# Patient Record
Sex: Male | Born: 2000 | Race: Black or African American | Hispanic: No | Marital: Single | Smoking: Current every day smoker
Health system: Southern US, Community
[De-identification: ages and names within clinical notes are randomized; demographics above are authoritative.]

## PROBLEM LIST (undated history)

## (undated) DIAGNOSIS — J45909 Unspecified asthma, uncomplicated: Secondary | ICD-10-CM

---
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
## 2020-12-03 IMAGING — CR ABDOMEN - 2 VIEW
3 series · 3 of 3 positions shown · non-contrast
Comparison: None.

CLINICAL DATA: Right-sided abdominal pain/cramping. Difficulty
swallowing for 2 days.

EXAM:
ABDOMEN - 2 VIEW

[abdomen erect]
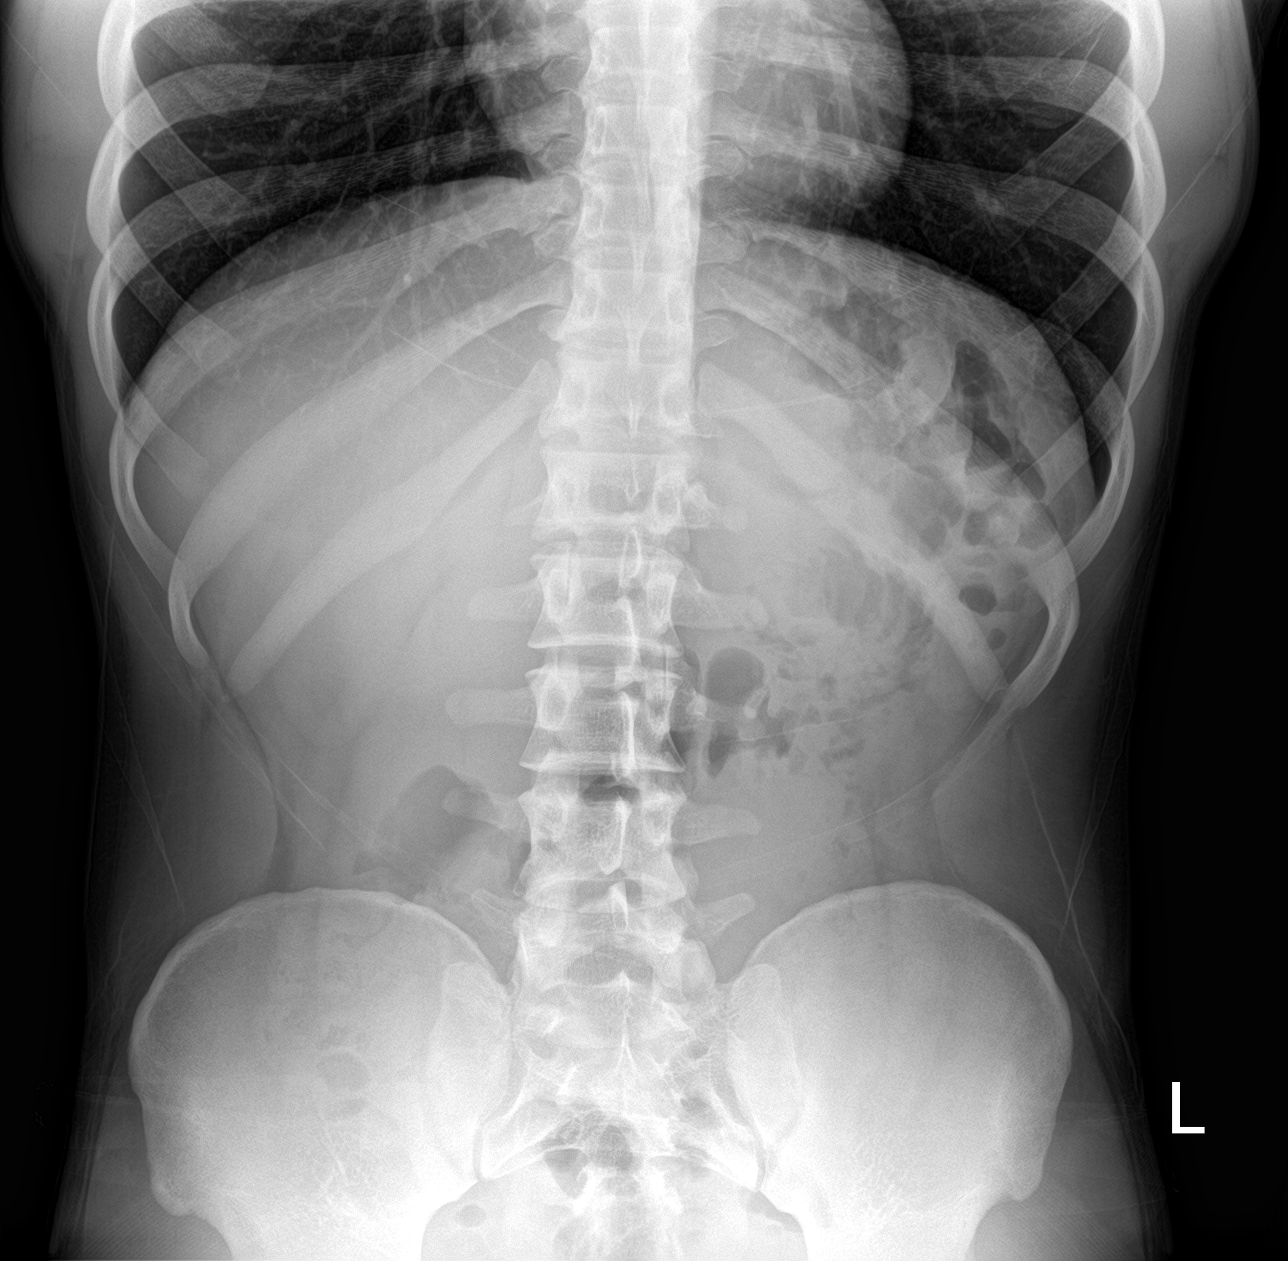

[abdomen supine (1 of 2)]
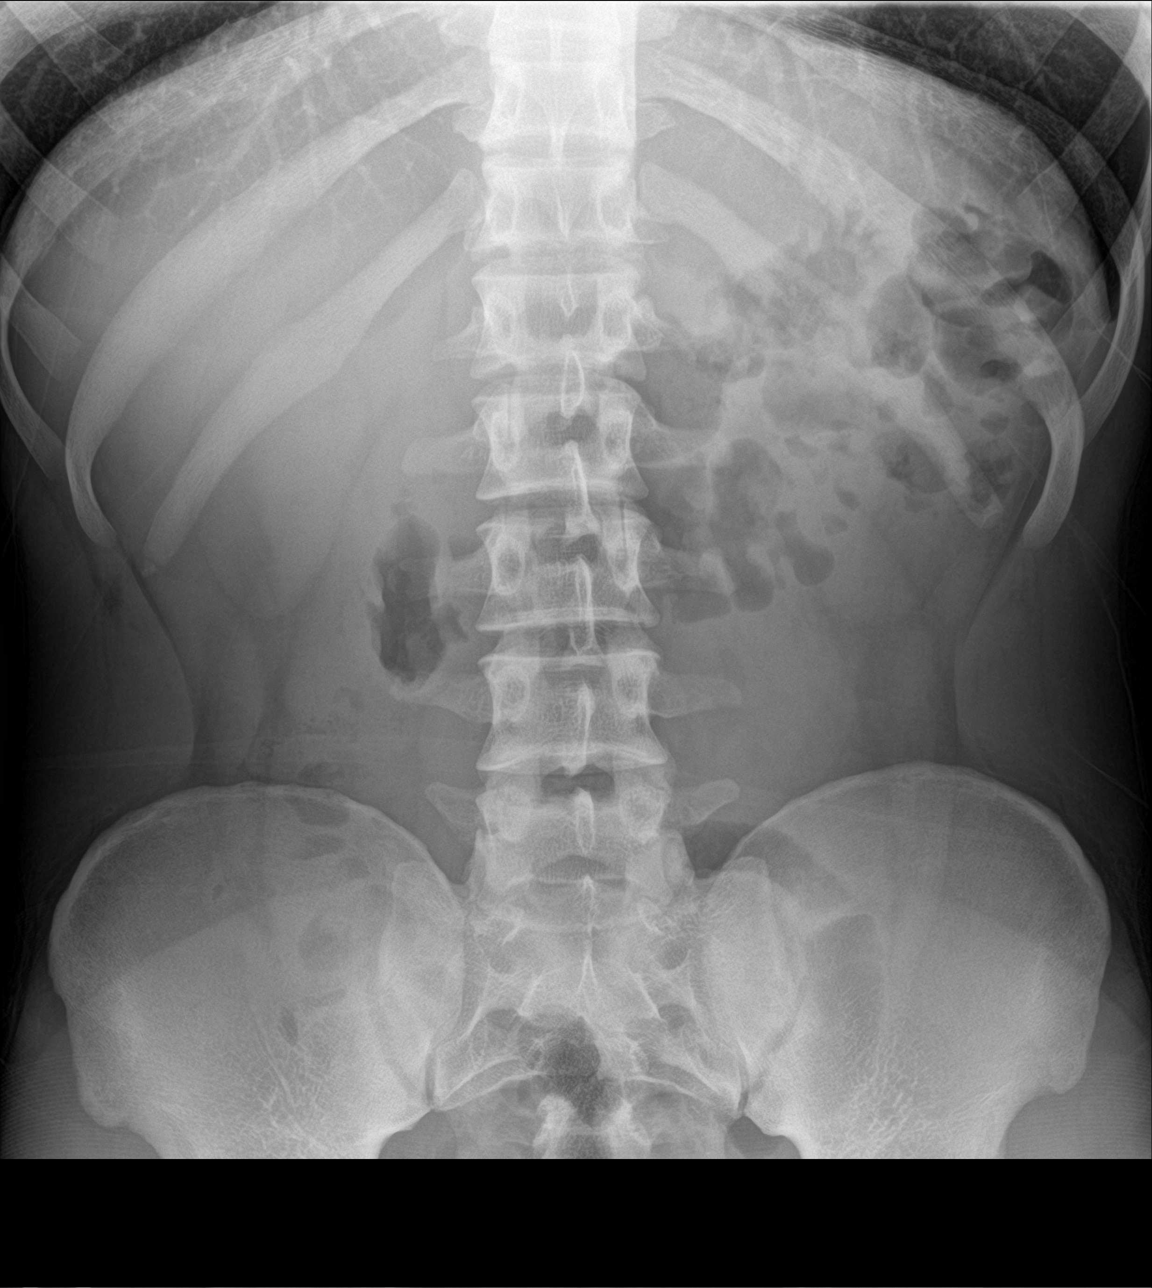

[abdomen supine (2 of 2)]
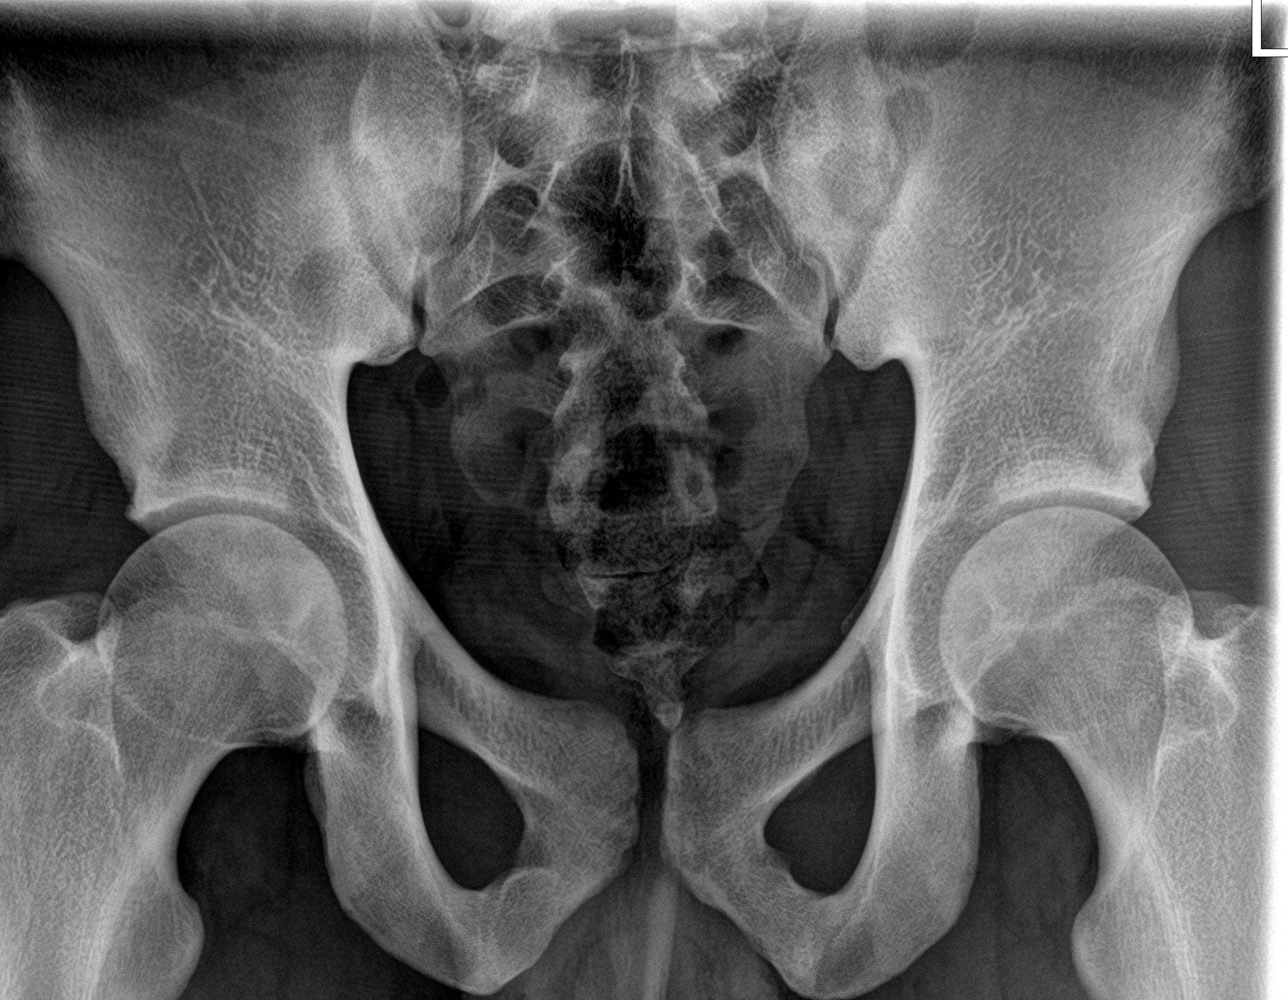

[3 of 3 positions shown; findings below may reference images not displayed]

FINDINGS: There is no evidence of intraperitoneal free air. Gas and a small
amount of stool are present in the colon and rectum. No dilated
loops of bowel are seen to suggest obstruction. The visualized lung
bases are clear. No abnormal soft tissue calcification is seen. The
osseous structures are unremarkable.
IMPRESSION: Negative.

## 2021-10-17 ENCOUNTER — Other Ambulatory Visit: Payer: Self-pay

## 2021-10-17 ENCOUNTER — Emergency Department (HOSPITAL_COMMUNITY)
Admission: EM | Admit: 2021-10-17 | Discharge: 2021-10-17 | Disposition: A | Discharge status: Home / Self Care | Payer: Self-pay | Attending: Emergency Medicine | Admitting: Emergency Medicine

## 2021-10-17 ENCOUNTER — Encounter (HOSPITAL_COMMUNITY): Payer: Self-pay

## 2021-10-17 DIAGNOSIS — N63 Unspecified lump in unspecified breast: Secondary | ICD-10-CM | POA: Insufficient documentation

## 2021-10-17 NOTE — ED Triage Notes (Signed)
Has a lump on left nipple that is tender.  Reports he just found it iin the shower today.

## 2021-10-17 NOTE — ED Provider Notes (Signed)
[facility] EMERGENCY DEPARTMENT Provider Note   CSN: [id number] Arrival date & time: 10/17/21  1225     History  Chief Complaint  Patient presents with   Mass    Travis Sloan is a 21 y.o. male with noncontributory past medical history who presents with concern for a left-sided mass behind the nipple that he noticed in the shower this morning.  Patient has not noticed any nipple drainage, tenderness, redness around the nipple.  He reports he did not notice it before today.  He not noticed that it has been rapidly growing or changing.  Denies strong family history of breast cancer.  Patient denies any pain from the lesion at this time, she is wondering what it could be.  HPI     Home Medications Prior to Admission medications   Medication Sig Start Date End Date Taking? Authorizing Provider  acetaminophen (TYLENOL) 325 MG tablet Take 650 mg by mouth every 6 (six) hours as needed.    [provider]  albuterol (PROVENTIL HFA;VENTOLIN HFA) 108 (90 BASE) MCG/ACT inhaler Inhale 2 puffs into the lungs every 6 (six) hours as needed.    [provider]  ibuprofen (ADVIL,MOTRIN) 600 MG tablet Take 1 tablet (600 mg total) by mouth every 6 (six) hours as needed for fever or mild pain. 05/11/18   Lowanda Foster, NP      Allergies    Patient has no known allergies.    Review of Systems   Review of Systems  All other systems reviewed and are negative.   Physical Exam Updated Vital Signs BP 118/70 (BP Location: Left Arm)   Pulse (!) 55   Temp 98.9 F (37.2 C) (Oral)   Resp 16   Ht 6\' 1"  (1.854 m)   Wt 79.4 kg   SpO2 99%   BMI 23.09 kg/m  Physical Exam Vitals and nursing note reviewed.  Constitutional:      General: He is not in acute distress.    Appearance: Normal appearance.  HENT:     Head: Normocephalic and atraumatic.  Eyes:     General:        Right eye: No discharge.        Left eye: No discharge.  Cardiovascular:     Rate and  Rhythm: Normal rate and regular rhythm.  Pulmonary:     Effort: Pulmonary effort is normal. No respiratory distress.  Musculoskeletal:        General: No deformity.  Skin:    General: Skin is warm and dry.     Comments: There is very small soft, mobile perhaps 0.5 cm nodule behind the left nipple.  It is not red, tender, there is no purulent discharge from the nipple.  Neurological:     Mental Status: He is alert and oriented to person, place, and time.  Psychiatric:        Mood and Affect: Mood normal.        Behavior: Behavior normal.     ED Results / Procedures / Treatments   Labs (all labs ordered are listed, but only abnormal results are displayed) Labs Reviewed - No data to display  EKG None  Radiology No results found.  Procedures Procedures    Medications Ordered in ED Medications - No data to display  ED Course/ Medical Decision Making/ A&P  Medical Decision Making  Patient with left-sided breast mass that he noticed in the shower today.  On physical exam I see no signs of infectious etiology.  Discussed differential including possible malignancy, discussed this is much less likely, versus normal breast tissue versus swollen lymph node versus gynecomastia versus other.  Discussed with patient I do not recommend any further work-up in the emergency department today, recommend that he reach out to a primary care doctor, or breast imaging center for further evaluation. In the meantime I recommend he continue to monitor for any growth or change of the mass.   Patient discharged in stable condition at this time. Final Clinical Impression(s) / ED Diagnoses Final diagnoses:  Mass of nipple    Rx / DC Orders ED Discharge Orders     None         [place] 10/17/21 1311    Pricilla Loveless, MD 10/20/21 1515

## 2021-10-17 NOTE — Discharge Instructions (Addendum)
You can try to call-year-old pediatrician or the number above to help establish new primary care doctor, or call some of the providers in your area and ask if they are taking new patients if you would like someone to take a second look.  As we discussed I think that the mass behind her nipple is normal tissue, however occasionally small masses behind the breast tissue can be a sign of something called gynecomastia which is a condition where you have abnormal breast growth in men, but is not necessarily dangerous.  Continue to monitor the mass to see if it is growing or changing, or becoming more irregular.  You could also reach out to a breast imaging center to see if they want to do a mammogram or ultrasound to further evaluate.  At this time this does not represent an urgent or emergent condition and I would not recommend any further work-up in the emergency department.

## 2021-10-19 ENCOUNTER — Encounter (HOSPITAL_COMMUNITY): Payer: Self-pay | Admitting: Emergency Medicine

## 2021-10-19 ENCOUNTER — Emergency Department (HOSPITAL_COMMUNITY)
Admission: EM | Admit: 2021-10-19 | Discharge: 2021-10-19 | Disposition: A | Discharge status: Home / Self Care | Payer: Self-pay | Attending: Emergency Medicine | Admitting: Emergency Medicine

## 2021-10-19 ENCOUNTER — Emergency Department (HOSPITAL_COMMUNITY): Payer: Self-pay

## 2021-10-19 ENCOUNTER — Other Ambulatory Visit: Payer: Self-pay

## 2021-10-19 DIAGNOSIS — R001 Bradycardia, unspecified: Secondary | ICD-10-CM | POA: Insufficient documentation

## 2021-10-19 DIAGNOSIS — R079 Chest pain, unspecified: Secondary | ICD-10-CM | POA: Insufficient documentation

## 2021-10-19 LAB — BASIC METABOLIC PANEL
Anion gap: 8 (ref 5–15)
BUN: 13 mg/dL (ref 6–20)
CO2: 28 mmol/L (ref 22–32)
Calcium: 9.5 mg/dL (ref 8.9–10.3)
Chloride: 104 mmol/L (ref 98–111)
Creatinine, Ser: 1.31 mg/dL — ABNORMAL HIGH (ref 0.61–1.24)
GFR, Estimated: >60 mL/min (ref >60)
Glucose, Bld: 94 mg/dL (ref 70–99)
Potassium: 3.5 mmol/L (ref 3.5–5.1)
Sodium: 140 mmol/L (ref 135–145)

## 2021-10-19 LAB — TROPONIN I (HIGH SENSITIVITY)
Troponin I (High Sensitivity): 3 ng/L (ref <18)
Troponin I (High Sensitivity): 3 ng/L (ref <18)

## 2021-10-19 LAB — CBC
HCT: 51.7 % (ref 39.0–52.0)
Hemoglobin: 17.1 g/dL — ABNORMAL HIGH (ref 13.0–17.0)
MCH: 28.3 pg (ref 26.0–34.0)
MCHC: 33.1 g/dL (ref 30.0–36.0)
MCV: 85.6 fL (ref 80.0–100.0)
Platelets: 164 10*3/uL (ref 150–400)
RBC: 6.04 MIL/uL — ABNORMAL HIGH (ref 4.22–5.81)
RDW: 13 % (ref 11.5–15.5)
WBC: 9.6 10*3/uL (ref 4.0–10.5)
nRBC: 0 % (ref 0.0–0.2)

## 2021-10-19 LAB — D-DIMER, QUANTITATIVE: D-Dimer, Quant: <0.27 ug/mL-FEU (ref 0.00–0.50)

## 2021-10-19 MED ORDER — ALBUTEROL SULFATE (2.5 MG/3ML) 0.083% IN NEBU
2.5000 mg | INHALATION_SOLUTION | Freq: Once | RESPIRATORY_TRACT | Status: AC
  Administered 2021-10-19: 2.5 mg via RESPIRATORY_TRACT
  Filled 2021-10-19: qty 3

## 2021-10-19 MED ORDER — ALBUTEROL SULFATE HFA 108 (90 BASE) MCG/ACT IN AERS: 1.0000 | INHALATION_SPRAY | Freq: Four times a day (QID) | RESPIRATORY_TRACT | 0 refills | Status: AC | PRN

## 2021-10-19 NOTE — ED Triage Notes (Signed)
Patient here with complaint of sharp chest pain and shortness of breath that started yesterday. Patient is alert, oriented, ambulatory, and in no apparent distress at this time. Patient has history asthma but has not use an inhaler in several years, patient states what he feels today feels similar to when he had asthma exacerbations as child.

## 2021-10-19 NOTE — ED Provider Triage Note (Signed)
Emergency Medicine Provider Triage Evaluation Note  Travis Sloan , a 21 y.o. male  was evaluated in triage.  Pt complains of sharp chest pain and shortness of breath that has worsened over the past 24 hours.  Patient states symptoms worsened while he was getting in the shower.  He admits to some palpitations.  Patient has a history of asthma and states that it feels like when he was younger having asthma attacks.  No wheeze.  Denies fever and chills.  Patient was evaluated yesterday for a nodule under his breast.  Review of Systems  Positive: CP, SOB Negative: fever  Physical Exam  BP (!) 131/90 (BP Location: Right Arm)   Pulse (!) 57   Temp 98.1 F (36.7 C) (Oral)   Resp 16   SpO2 100%  Gen:   Awake, no distress   Resp:  Normal effort  MSK:   Moves extremities without difficulty  Other:  No wheeze  Medical Decision Making  Medically screening exam initiated at 3:30 PM.  Appropriate orders placed.  Libero Puthoff was informed that the remainder of the evaluation will be completed by another provider, this initial triage assessment does not replace that evaluation, and the importance of remaining in the ED until their evaluation is complete.  Cardiac labs ordered   Jesusita Oka 10/19/21 1531

## 2021-10-19 NOTE — ED Provider Notes (Signed)
[facility] EMERGENCY DEPARTMENT Provider Note   CSN: [id number] Arrival date & time: 10/19/21  1443     History  Chief Complaint  Patient presents with   Chest Pain    Travis Sloan is a 21 y.o. male.   Chest Pain  21 year old male presents emergency department with complaints of chest pain and shortness of breath.  He states acute onset 2 days ago.  He states that it feels similar to asthma exacerbations when he was younger and has not used his inhaler for the past 3 to 4 years.  He describes the chest pain as sharp and left-sided in nature without radiation.  He notes worsening of symptoms with taking a shower.  Denies worsening of symptoms with physical activity.  Denies history of DVT/PE, recent surgery or immobilization.  Denies cough, fever, chills, night sweats, abdominal pain, nausea, vomiting.  He states he uses marijuana but denies cigarette use for the past 1 to 2 years.  Denies illicit drug use including cocaine.  Past medical history significant for asthma  Home Medications Prior to Admission medications   Medication Sig Start Date End Date Taking? Authorizing Provider  albuterol (VENTOLIN HFA) 108 (90 Base) MCG/ACT inhaler Inhale 1-2 puffs into the lungs every 6 (six) hours as needed for wheezing or shortness of breath. 10/19/21  Yes Sherian Maroon A, PA  acetaminophen (TYLENOL) 325 MG tablet Take 650 mg by mouth every 6 (six) hours as needed.    [provider]  ibuprofen (ADVIL,MOTRIN) 600 MG tablet Take 1 tablet (600 mg total) by mouth every 6 (six) hours as needed for fever or mild pain. 05/11/18   Lowanda Foster, NP      Allergies    Patient has no known allergies.    Review of Systems   Review of Systems  Cardiovascular:  Positive for chest pain.  All other systems reviewed and are negative.   Physical Exam Updated Vital Signs BP 132/81   Pulse 65   Temp 98.4 F (36.9 C) (Oral)   Resp 16   Ht 6\' 1"  (1.854 m)   Wt 79 kg    SpO2 100%   BMI 22.98 kg/m  Physical Exam Vitals and nursing note reviewed.  Constitutional:      General: He is not in acute distress.    Appearance: He is well-developed.  HENT:     Head: Normocephalic and atraumatic.  Eyes:     Conjunctiva/sclera: Conjunctivae normal.  Cardiovascular:     Rate and Rhythm: Normal rate and regular rhythm.     Heart sounds: No murmur heard. Pulmonary:     Effort: Pulmonary effort is normal. No respiratory distress.     Breath sounds: Examination of the left-upper field reveals wheezing. Examination of the right-middle field reveals wheezing. Wheezing present.     Comments: Mild wheeze noted bilateral middle lung fields. Abdominal:     Palpations: Abdomen is soft.     Tenderness: There is no abdominal tenderness.  Musculoskeletal:        General: No swelling.     Cervical back: Normal range of motion and neck supple.     Right lower leg: No edema.     Left lower leg: No edema.  Skin:    General: Skin is warm and dry.     Capillary Refill: Capillary refill takes less than 2 seconds.  Neurological:     Mental Status: He is alert.  Psychiatric:        Mood  and Affect: Mood normal.     ED Results / Procedures / Treatments   Labs (all labs ordered are listed, but only abnormal results are displayed) Labs Reviewed  BASIC METABOLIC PANEL - Abnormal; Notable for the following components:      Result Value   Creatinine, Ser 1.31 (*)    All other components within normal limits  CBC - Abnormal; Notable for the following components:   RBC 6.04 (*)    Hemoglobin 17.1 (*)    All other components within normal limits  D-DIMER, QUANTITATIVE  TROPONIN I (HIGH SENSITIVITY)  TROPONIN I (HIGH SENSITIVITY)    EKG EKG Interpretation  Date/Time:  Wednesday October 19 2021 15:22:01 EDT Ventricular Rate:  56 PR Interval:  150 QRS Duration: 102 QT Interval:  394 QTC Calculation: 380 R Axis:   65 Text Interpretation: Sinus bradycardia with sinus  arrhythmia Incomplete right bundle branch block Borderline ECG No previous ECGs available Confirmed by Kristine Royal [phone]) on 10/19/2021 6:39:36 PM  Radiology DG Chest 2 View  Result Date: 10/19/2021 CLINICAL DATA:  chest pain EXAM: CHEST - 2 VIEW COMPARISON:  None Available. FINDINGS: The heart size and mediastinal contours are within normal limits. Both lungs are hyperinflated and clear. The visualized skeletal structures are unremarkable. IMPRESSION: No active cardiopulmonary disease. Electronically Signed   By: Marjo Bicker M.D.   On: 10/19/2021 15:57    Procedures Procedures    Medications Ordered in ED Medications  albuterol (PROVENTIL) (2.5 MG/3ML) 0.083% nebulizer solution 2.5 mg (2.5 mg Nebulization Given 10/19/21 1950)    ED Course/ Medical Decision Making/ A&P                           Medical Decision Making Amount and/or Complexity of Data Reviewed Labs: ordered. Radiology: ordered.  Risk Prescription drug management.   This patient presents to the ED for concern of shortness of breath/chest pain, this involves an extensive number of treatment options, and is a complaint that carries with it a high risk of complications and morbidity.  The differential diagnosis includes ACS, pulmonary embolism, pneumothorax, pneumonia, asthma exacerbation, COPD exacerbation,   Co morbidities that complicate the patient evaluation  See HPI   Additional history obtained:  Additional history obtained from EMR External records from outside source obtained and reviewed including visit 2 days ago for left mass near her nipple.   Lab Tests:  I Ordered, and personally interpreted labs.  The pertinent results include: No electrolyte abnormality.  Renal function within normal limits.  No leukocytosis.  Mild elevation hemoglobin.  No platelet abnormality.  Initial troponin 3 with delta negative.  EKG findings indicative of incomplete right bundle branch block with negative delta  troponin, ACS still deemed unlikely.  Close follow-up with cardiologist recommended outpatient.  D-dimer of less than 0.27 making pulmonary embolism significantly unlikely.   Imaging Studies ordered:  I ordered imaging studies including chest x-ray I independently visualized and interpreted imaging which showed no acute cardiopulmonary process. I agree with the radiologist interpretation   Cardiac Monitoring: / EKG:  The patient was maintained on a cardiac monitor.  I personally viewed and interpreted the cardiac monitored which showed an underlying rhythm of: Sinus bradycardia with sinus arrhythmia.  ST elevation in V3 with no reciprocal depression.   Consultations Obtained:  N/a   Problem List / ED Course / Critical interventions / Medication management  Chest pain/shortness of breath I ordered medication including albuterol for wheeze  Reevaluation of the patient after these medicines showed that the patient improved I have reviewed the patients home medicines and have made adjustments as needed   Social Determinants of Health:  Continue marijuana/cigarette use.  Denies illicit drug use.   Test / Admission - Considered:  Chest pain/shortness of breath Vitals signs within normal range and stable throughout visit. Laboratory/imaging studies significant for: See above Patient had near resolution of symptoms with administration of albuterol while in the emergency department.  Delta negative troponin with no EKG findings indicative of ischemia.  Doubt ACS.  Doubt PE given negative D-dimer.  Doubt pericarditis/myocarditis.  Doubt cardiac tamponade.  Patient symptoms likely secondary to mild asthma exacerbation.  Patient prescribed albuterol inhaler to use as needed outpatient.  Close follow-up with primary care provider recommended in 2 to 3 days for reevaluation of symptoms.  Treatment plan was discussed at length with patient who acknowledged understanding was agreeable to said  plan. Worrisome signs and symptoms were discussed with the patient, and the patient acknowledged understanding to return to the ED if noticed. Patient was stable upon discharge. ,         Final Clinical Impression(s) / ED Diagnoses Final diagnoses:  Chest pain, unspecified type    Rx / DC Orders ED Discharge Orders          Ordered    albuterol (VENTOLIN HFA) 108 (90 Base) MCG/ACT inhaler  Every 6 hours PRN        10/19/21 2014              Wilnette Kales, [place] 10/20/21 1759    Valarie Merino, MD 10/22/21 [phone]

## 2021-10-19 NOTE — Discharge Instructions (Signed)
Note the work-up today was overall reassuring.  All the laboratory studies as well as EKG and chest x-ray looked well.  No concern at this time for a blood clot in your lung or heart attack.  Since she had improvement with the breathing treatment in the emergency department, I will prescribe an albuterol inhaler to use as needed at home.  Close follow-up with primary care provider recommended in 2 to 3 days for reevaluation of your symptoms.  Please do not hesitate to return to the emergency department if the worrisome signs and symptoms we discussed become apparent.

## 2021-10-20 ENCOUNTER — Emergency Department (HOSPITAL_COMMUNITY)
Admission: EM | Admit: 2021-10-20 | Discharge: 2021-10-20 | Disposition: A | Discharge status: Home / Self Care | Payer: Self-pay | Attending: Emergency Medicine | Admitting: Emergency Medicine

## 2021-10-20 ENCOUNTER — Encounter (HOSPITAL_COMMUNITY): Payer: Self-pay

## 2021-10-20 ENCOUNTER — Other Ambulatory Visit: Payer: Self-pay

## 2021-10-20 DIAGNOSIS — J4521 Mild intermittent asthma with (acute) exacerbation: Secondary | ICD-10-CM | POA: Insufficient documentation

## 2021-10-20 DIAGNOSIS — R002 Palpitations: Secondary | ICD-10-CM | POA: Insufficient documentation

## 2021-10-20 MED ORDER — IPRATROPIUM-ALBUTEROL 0.5-2.5 (3) MG/3ML IN SOLN
3.0000 mL | Freq: Once | RESPIRATORY_TRACT | Status: AC
  Administered 2021-10-20: 3 mL via RESPIRATORY_TRACT
  Filled 2021-10-20: qty 3

## 2021-10-20 NOTE — ED Provider Notes (Signed)
[facility]-EMERGENCY DEPT Provider Note   CSN: [id number] Arrival date & time: 10/20/21  1542     History  Chief Complaint  Patient presents with   Asthma   Palpitations    Travis Sloan is a 21 y.o. male with complaints of shortness of breath, palpitations and wheezing and is concerned about asthma exacerbation.  Patient states that he was getting out of the shower when he felt his heart beat pretty fast along with increased shortness of breath and wheezing.  He took his inhaler and he did have improvement in the wheezing and shortness of breath, however his palpitations worsened.  Patient was seen at [facility] yesterday and had normal cardio work-up and chest x-ray done at that time.  He had refill of his inhaler provided.  Patient states that he was feeling better when he left [place] yesterday up until he had his shower earlier today.  Currently he is feeling much better although still has very mild shortness of breath.  He denies fever, chills, cough, congestion, chest pain, abdominal pain, nausea, vomiting, diarrhea.   Asthma Associated symptoms include shortness of breath. Pertinent negatives include no chest pain.  Palpitations Associated symptoms: shortness of breath   Associated symptoms: no chest pain and no cough        Home Medications Prior to Admission medications   Medication Sig Start Date End Date Taking? Authorizing Provider  acetaminophen (TYLENOL) 325 MG tablet Take 650 mg by mouth every 6 (six) hours as needed.    [provider]  albuterol (VENTOLIN HFA) 108 (90 Base) MCG/ACT inhaler Inhale 1-2 puffs into the lungs every 6 (six) hours as needed for wheezing or shortness of breath. 10/19/21   Peter Garter, PA  ibuprofen (ADVIL,MOTRIN) 600 MG tablet Take 1 tablet (600 mg total) by mouth every 6 (six) hours as needed for fever or mild pain. 05/11/18   Lowanda Foster, NP      Allergies    Patient has no known allergies.     Review of Systems   Review of Systems  Constitutional:  Negative for fever.  Respiratory:  Positive for shortness of breath and wheezing. Negative for cough.   Cardiovascular:  Positive for palpitations. Negative for chest pain.    Physical Exam Updated Vital Signs BP 117/83 (BP Location: Left Arm)   Pulse 62   Temp 98 F (36.7 C) (Oral)   Resp 16   SpO2 99%  Physical Exam Vitals and nursing note reviewed.  Constitutional:      General: He is not in acute distress.    Appearance: He is not ill-appearing.  HENT:     Head: Atraumatic.  Eyes:     Conjunctiva/sclera: Conjunctivae normal.  Cardiovascular:     Rate and Rhythm: Normal rate and regular rhythm.     Pulses: Normal pulses.     Heart sounds: No murmur heard. Pulmonary:     Effort: Pulmonary effort is normal. No respiratory distress.     Breath sounds: Normal breath sounds.  Abdominal:     General: Abdomen is flat. There is no distension.     Palpations: Abdomen is soft.     Tenderness: There is no abdominal tenderness.  Musculoskeletal:        General: Normal range of motion.     Cervical back: Normal range of motion.  Skin:    General: Skin is warm and dry.     Capillary Refill: Capillary refill takes less than 2  seconds.  Neurological:     General: No focal deficit present.     Mental Status: He is alert.  Psychiatric:        Mood and Affect: Mood normal.     ED Results / Procedures / Treatments   Labs (all labs ordered are listed, but only abnormal results are displayed) Labs Reviewed - No data to display  EKG None  Radiology DG Chest 2 View  Result Date: 10/19/2021 CLINICAL DATA:  chest pain EXAM: CHEST - 2 VIEW COMPARISON:  None Available. FINDINGS: The heart size and mediastinal contours are within normal limits. Both lungs are hyperinflated and clear. The visualized skeletal structures are unremarkable. IMPRESSION: No active cardiopulmonary disease. Electronically Signed   By: Marjo Bicker  M.D.   On: 10/19/2021 15:57    Procedures Procedures    Medications Ordered in ED Medications  ipratropium-albuterol (DUONEB) 0.5-2.5 (3) MG/3ML nebulizer solution 3 mL (3 mLs Nebulization Given 10/20/21 1633)    ED Course/ Medical Decision Making/ A&P                           Medical Decision Making Risk Prescription drug management.   This is a 21 year old male who presents to the emergency department for evaluation of wheezing, shortness of breath and palpitations with concern for asthma exacerbation.  Differentials include asthma exacerbation, pneumonia, ACS, PE.  Comorbidities include history of asthma.  Labs and imaging reviewed from ED visit yesterday which showed normal troponins, EKG and chest x-ray.  BMP and CBC were also normal at that time as well.  Symptoms today are not consistent with PE.  On exam, he is satting well on room air and is in no acute respiratory distress.  His lungs CTA bilaterally without evidence of wheezing.  Heart rate fluctuating between 59-62 here in the emergency department.  Given his normal work-up yesterday without any new features to his symptoms, I do not think additional labs or imaging was indicated at this time.  Patient is currently denying chest pain, and symptoms are most consistent with a mild asthma exacerbation.  Advised patient that it is normal to use the inhaler and have increase in heart rate due to the side effects of the medication.  He was given a DuoNeb here for his lingering shortness of breath with some mild improvement.  Advised to use his inhaler as necessary.  Return precautions were discussed.  Discharged home in stable condition.  Final Clinical Impression(s) / ED Diagnoses Final diagnoses:  Mild intermittent asthma with exacerbation    Rx / DC Orders ED Discharge Orders     None         Delight Ovens 10/20/21 1655    Lorre Nick, MD 10/21/21 2107

## 2021-10-20 NOTE — ED Provider Triage Note (Signed)
Emergency Medicine Provider Triage Evaluation Note  Travis Sloan , a 21 y.o. male  was evaluated in triage.  Pt complains of asthma exacerbation.  Patient states he was getting out of the shower when he felt his heart began beating really fast along with wheezing when he spoke and shortness of breath.  He took his albuterol inhaler which helped a little bit with his shortness of breath although it seemed to make his palpitations worse.  Currently feeling much better although still endorses some mild shortness of breath.  Review of Systems  Positive:  Negative:   Physical Exam  BP 117/83 (BP Location: Left Arm)   Pulse 62   Temp 98 F (36.7 C) (Oral)   Resp 16   SpO2 99%  Gen:   Awake, no distress   Resp:  Normal effort, lungs CTA MSK:   Moves extremities without difficulty  Other:    Medical Decision Making  Medically screening exam initiated at 4:00 PM.  Appropriate orders placed.  Hallie Ishida was informed that the remainder of the evaluation will be completed by another provider, this initial triage assessment does not replace that evaluation, and the importance of remaining in the ED until their evaluation is complete.     Janell Quiet, [place] 10/20/21 1601

## 2021-10-20 NOTE — Discharge Instructions (Addendum)
Continue to use your inhaler as needed if you feel short of breath.  Please note that it is side effect of the medication that anytime you use your albuterol inhaler it can cause an increase in heart rate which is likely the sensation that you are experiencing.  Follow-up with your primary care doctor as provided.

## 2021-10-20 NOTE — ED Triage Notes (Signed)
Pt reports being seen for asthma attack yesterday and was given an albuterol inhaler. Pt reports while taking a shower today he began having an asthma attack. Pt states he used his inhaler and felt palpitations and his HR increased. Pt endorses some mild SHOB and chest tightness.

## 2021-10-21 ENCOUNTER — Encounter (HOSPITAL_COMMUNITY): Payer: Self-pay | Admitting: Emergency Medicine

## 2021-10-21 ENCOUNTER — Other Ambulatory Visit: Payer: Self-pay

## 2021-10-21 ENCOUNTER — Emergency Department (HOSPITAL_COMMUNITY)
Admission: EM | Admit: 2021-10-21 | Discharge: 2021-10-22 | Discharge status: Against Medical Advice | Payer: Self-pay | Attending: Emergency Medicine | Admitting: Emergency Medicine

## 2021-10-21 DIAGNOSIS — Z5321 Procedure and treatment not carried out due to patient leaving prior to being seen by health care provider: Secondary | ICD-10-CM | POA: Insufficient documentation

## 2021-10-21 DIAGNOSIS — R0602 Shortness of breath: Secondary | ICD-10-CM | POA: Insufficient documentation

## 2021-10-21 DIAGNOSIS — R002 Palpitations: Secondary | ICD-10-CM | POA: Insufficient documentation

## 2021-10-21 NOTE — ED Triage Notes (Signed)
Pt reports palpitations and SHOB today. Denies CP

## 2021-11-13 ENCOUNTER — Encounter (HOSPITAL_COMMUNITY): Payer: Self-pay

## 2021-11-13 ENCOUNTER — Emergency Department (HOSPITAL_COMMUNITY): Payer: Self-pay

## 2021-11-13 ENCOUNTER — Emergency Department (HOSPITAL_COMMUNITY)
Admission: EM | Admit: 2021-11-13 | Discharge: 2021-11-13 | Disposition: A | Discharge status: Home / Self Care | Payer: Self-pay | Attending: Emergency Medicine | Admitting: Emergency Medicine

## 2021-11-13 DIAGNOSIS — Z7951 Long term (current) use of inhaled steroids: Secondary | ICD-10-CM | POA: Insufficient documentation

## 2021-11-13 DIAGNOSIS — R079 Chest pain, unspecified: Secondary | ICD-10-CM | POA: Insufficient documentation

## 2021-11-13 DIAGNOSIS — D72829 Elevated white blood cell count, unspecified: Secondary | ICD-10-CM | POA: Insufficient documentation

## 2021-11-13 DIAGNOSIS — J45909 Unspecified asthma, uncomplicated: Secondary | ICD-10-CM | POA: Insufficient documentation

## 2021-11-13 DIAGNOSIS — R091 Pleurisy: Secondary | ICD-10-CM | POA: Insufficient documentation

## 2021-11-13 DIAGNOSIS — E876 Hypokalemia: Secondary | ICD-10-CM | POA: Insufficient documentation

## 2021-11-13 DIAGNOSIS — R0602 Shortness of breath: Secondary | ICD-10-CM | POA: Insufficient documentation

## 2021-11-13 DIAGNOSIS — R42 Dizziness and giddiness: Secondary | ICD-10-CM | POA: Insufficient documentation

## 2021-11-13 LAB — CBC WITH DIFFERENTIAL/PLATELET
Abs Immature Granulocytes: 0.03 10*3/uL (ref 0.00–0.07)
Basophils Absolute: 0.1 10*3/uL (ref 0.0–0.1)
Basophils Relative: 1 %
Eosinophils Absolute: 0.3 10*3/uL (ref 0.0–0.5)
Eosinophils Relative: 3 %
HCT: 47.8 % (ref 39.0–52.0)
Hemoglobin: 15.9 g/dL (ref 13.0–17.0)
Immature Granulocytes: 0 %
Lymphocytes Relative: 30 %
Lymphs Abs: 3.4 10*3/uL (ref 0.7–4.0)
MCH: 28.5 pg (ref 26.0–34.0)
MCHC: 33.3 g/dL (ref 30.0–36.0)
MCV: 85.8 fL (ref 80.0–100.0)
Monocytes Absolute: 0.7 10*3/uL (ref 0.1–1.0)
Monocytes Relative: 6 %
Neutro Abs: 6.8 10*3/uL (ref 1.7–7.7)
Neutrophils Relative %: 60 %
Platelets: 161 10*3/uL (ref 150–400)
RBC: 5.57 MIL/uL (ref 4.22–5.81)
RDW: 12.5 % (ref 11.5–15.5)
WBC: 11.3 10*3/uL — ABNORMAL HIGH (ref 4.0–10.5)
nRBC: 0 % (ref 0.0–0.2)

## 2021-11-13 LAB — BASIC METABOLIC PANEL
Anion gap: 8 (ref 5–15)
BUN: 16 mg/dL (ref 6–20)
CO2: 28 mmol/L (ref 22–32)
Calcium: 9.4 mg/dL (ref 8.9–10.3)
Chloride: 105 mmol/L (ref 98–111)
Creatinine, Ser: 1.14 mg/dL (ref 0.61–1.24)
GFR, Estimated: >60 mL/min (ref >60)
Glucose, Bld: 83 mg/dL (ref 70–99)
Potassium: 3.2 mmol/L — ABNORMAL LOW (ref 3.5–5.1)
Sodium: 141 mmol/L (ref 135–145)

## 2021-11-13 LAB — TROPONIN I (HIGH SENSITIVITY): Troponin I (High Sensitivity): <2 ng/L

## 2021-11-13 MED ORDER — POTASSIUM CHLORIDE CRYS ER 20 MEQ PO TBCR
40.0000 meq | EXTENDED_RELEASE_TABLET | Freq: Once | ORAL | Status: AC
  Administered 2021-11-13: 40 meq via ORAL
  Filled 2021-11-13: qty 2

## 2021-11-13 MED ORDER — MAGNESIUM OXIDE -MG SUPPLEMENT 400 (240 MG) MG PO TABS
800.0000 mg | ORAL_TABLET | Freq: Once | ORAL | Status: AC
Start: 2021-11-13 — End: 2021-11-13
  Administered 2021-11-13: 800 mg via ORAL
  Filled 2021-11-13: qty 2

## 2021-11-13 NOTE — Discharge Instructions (Signed)
Take ibuprofen as needed for chest or nipple discomfort.  Utilize albuterol as needed for chest tightness and shortness of breath.  Return to the emergency department at any time for any new or worsening symptoms of concern.

## 2021-11-13 NOTE — ED Provider Notes (Signed)
Travis Sloan   CSN: [id number] Arrival date & time: 11/13/21  1357     History  Chief Complaint  Patient presents with   Shortness of Breath   Chest Pain    Travis Sloan is a 21 y.o. male.   Shortness of Breath Associated symptoms: chest pain   Chest Pain Associated symptoms: shortness of breath   Patient presents for chest pain and shortness of breath.  Medical history includes asthma.  Location of pain has been left-sided.  Patient reports this has been intermittent over the past month.  When it occurs, he does endorse pleurisy and shortness of breath.  He has also noticed some slight swelling of his left areola with some associated tenderness.  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  Last night, during his shift, he did notice some transient dizziness.  This has resolved and has not occurred today.  Patient is currently asymptomatic.     Home Medications Prior to Admission medications   Medication Sig Start Date End Date Taking? Authorizing Provider  acetaminophen (TYLENOL) 325 MG tablet Take 650 mg by mouth every 6 (six) hours as needed.    [provider]  albuterol (VENTOLIN HFA) 108 (90 Base) MCG/ACT inhaler Inhale 1-2 puffs into the lungs every 6 (six) hours as needed for wheezing or shortness of breath. 10/19/21   Wilnette Kales, PA  ibuprofen (ADVIL,MOTRIN) 600 MG tablet Take 1 tablet (600 mg total) by mouth every 6 (six) hours as needed for fever or mild pain. 05/11/18   Kristen Cardinal, NP      Allergies    Patient has no known allergies.    Review of Systems   Review of Systems  Respiratory:  Positive for shortness of breath.   Cardiovascular:  Positive for chest pain.  All other systems reviewed and are negative.   Physical Exam Updated Vital Signs BP 138/85   Pulse 64   Temp 97.9 F (36.6 C) (Oral)   Resp 13   SpO2 100%  Physical Exam Vitals and nursing Sloan reviewed.   Constitutional:      General: He is not in acute distress.    Appearance: He is well-developed and normal weight. He is not ill-appearing, toxic-appearing or diaphoretic.  HENT:     Head: Normocephalic and atraumatic.     Mouth/Throat:     Mouth: Mucous membranes are moist.     Pharynx: Oropharynx is clear.  Eyes:     Conjunctiva/sclera: Conjunctivae normal.  Cardiovascular:     Rate and Rhythm: Normal rate and regular rhythm.     Heart sounds: No murmur heard. Pulmonary:     Effort: Pulmonary effort is normal. No respiratory distress.     Breath sounds: No decreased breath sounds, wheezing, rhonchi or rales.  Chest:     Chest wall: No tenderness.  Abdominal:     Palpations: Abdomen is soft.     Tenderness: There is no abdominal tenderness.  Musculoskeletal:        General: No swelling.     Cervical back: Normal range of motion and neck supple.     Right lower leg: No edema.     Left lower leg: No edema.  Skin:    General: Skin is warm and dry.     Capillary Refill: Capillary refill takes less than 2 seconds.  Neurological:     General: No focal deficit present.     Mental Status: He is alert and  oriented to person, place, and time.  Psychiatric:        Mood and Affect: Mood normal.        Behavior: Behavior normal.     ED Results / Procedures / Treatments   Labs (all labs ordered are listed, but only abnormal results are displayed) Labs Reviewed  CBC WITH DIFFERENTIAL/PLATELET - Abnormal; Notable for the following components:      Result Value   WBC 11.3 (*)    All other components within normal limits  BASIC METABOLIC PANEL - Abnormal; Notable for the following components:   Potassium 3.2 (*)    All other components within normal limits  TROPONIN I (HIGH SENSITIVITY)    EKG EKG Interpretation  Date/Time:  Sunday November 13 2021 14:34:32 EDT Ventricular Rate:  62 PR Interval:  162 QRS Duration: 109 QT Interval:  380 QTC Calculation: 386 R  Axis:   -18 Text Interpretation: Sinus rhythm Borderline left axis deviation ST elev, probable normal early repol pattern Confirmed by Gloris Manchester ([id number]) on 11/13/2021 4:10:10 PM  Radiology DG Chest 2 View  Result Date: 11/13/2021 CLINICAL DATA:  No onset mid left-sided chest pain, short of breath since yesterday EXAM: CHEST - 2 VIEW COMPARISON:  10/19/2021 FINDINGS: Frontal and lateral views of the chest demonstrate an unremarkable cardiac silhouette. No airspace disease, effusion, or pneumothorax. No acute bony abnormalities. IMPRESSION: 1. No acute intrathoracic process. Electronically Signed   By: Sharlet Salina M.D.   On: 11/13/2021 14:57    Procedures Procedures    Medications Ordered in ED Medications  potassium chloride SA (KLOR-CON M) CR tablet 40 mEq (has no administration in time range)  magnesium oxide (MAG-OX) tablet 800 mg (has no administration in time range)    ED Course/ Medical Decision Making/ A&P                           Medical Decision Making  This patient presents to the ED for concern of chest pain, this involves an extensive number of treatment options, and is a complaint that carries with it a high risk of complications and morbidity.  The differential diagnosis includes pericarditis, costochondritis, chest wall inflammation, asthma, ACS, pneumonia   Co morbidities that complicate the patient evaluation  Asthma   Additional history obtained:  Additional history obtained from patient's mother External records from outside source obtained and reviewed including EMR   Lab Tests:  I Ordered, and personally interpreted labs.  The pertinent results include: Mild hypokalemia, slight leukocytosis, otherwise unremarkable lab findings, including troponin   Imaging Studies ordered:  I ordered imaging studies including chest x-ray I independently visualized and interpreted imaging which showed no acute findings I agree with the radiologist  interpretation   Cardiac Monitoring: / EKG:  The patient was maintained on a cardiac monitor.  I personally viewed and interpreted the cardiac monitored which showed an underlying rhythm of: This rhythm   Problem List / ED Course / Critical interventions / Medication management  Patient presents for left-sided chest pain since yesterday with associated shortness of breath.  He does have a history of asthma with states that symptoms feel different from previous asthma exacerbations.  Prior to being bedded in the ED, diagnostic work-up was initiated.  Lab work is notable for mild hypokalemia and leukocytosis.  Initial troponin is normal.  Chest x-ray shows no acute findings.  EKG shows no changes from prior tracings.  On assessment, patient is well-appearing.  He is currently asymptomatic.  His lungs are clear to auscultation.  He does mention recent swelling and tenderness to his left areola.  This does appear slightly swollen compared to his right.  There is no underlying fluctuance.  I suspect this is simply hormonal effect.  Patient was given reassurance.  He was given replacement potassium and magnesium.  He was advised to take ibuprofen as needed for chest discomfort and utilize albuterol inhalers as needed for chest tightness.  He is stable for discharge at this time. I ordered medication including potassium chloride and magnesium oxide for fluid optimization Reevaluation of the patient after these medicines showed that the patient stayed the same I have reviewed the patients home medicines and have made adjustments as needed   Social Determinants of Health:  Lives independently        Final Clinical Impression(s) / ED Diagnoses Final diagnoses:  Chest pain, unspecified type    Rx / DC Orders ED Discharge Orders     None         Gloris Manchester, MD 11/13/21 1636

## 2021-11-13 NOTE — ED Triage Notes (Signed)
Pt c/o of one day of SOB and L sided CP.

## 2021-11-13 NOTE — ED Provider Triage Note (Signed)
Emergency Medicine Provider Triage Evaluation Note  Travis Sloan , a 21 y.o. male  was evaluated in triage.  Pt complains of shortness of breath and left-sided chest pain.  States this is not typical for his asthma exacerbation.  He denies any wheezing.  Has been ongoing since yesterday.  Review of Systems  Positive: As above Negative: As above  Physical Exam  There were no vitals taken for this visit. Gen:   Awake, no distress   Resp:  Normal effort  MSK:   Moves extremities without difficulty  Other:    Medical Decision Making  Medically screening exam initiated at 2:29 PM.  Appropriate orders placed.  Travis Sloan was informed that the remainder of the evaluation will be completed by another provider, this initial triage assessment does not replace that evaluation, and the importance of remaining in the ED until their evaluation is complete.     Evlyn Courier, PA-C 11/13/21 1430

## 2023-07-18 ENCOUNTER — Encounter (HOSPITAL_COMMUNITY): Payer: Self-pay

## 2023-07-18 ENCOUNTER — Ambulatory Visit (HOSPITAL_COMMUNITY)
Admission: RE | Admit: 2023-07-18 | Discharge: 2023-07-18 | Disposition: A | Discharge status: Home / Self Care | Payer: Self-pay | Source: Ambulatory Visit | Attending: Family Medicine | Admitting: Family Medicine

## 2023-07-18 VITALS — BP 128/83 | HR 57 | Temp 98.3°F | Resp 16

## 2023-07-18 DIAGNOSIS — L910 Hypertrophic scar: Secondary | ICD-10-CM

## 2023-07-18 NOTE — ED Triage Notes (Signed)
 Patient here today with c/o right earlobe injury X 4-5 days. Patient was playing basketball and someone accidentally yanked his earring out of his ear. Patient has significant swelling and itching to the earlobe.

## 2023-07-18 NOTE — ED Provider Notes (Signed)
  [facility]   [id number] 07/18/23 Arrival Time: 1058  ASSESSMENT & PLAN:  1. Keloid of skin    Without signs of bleeding/infection.  Orders Placed This Encounter  Procedures   Ambulatory referral to Dermatology    Referral Priority:   Routine    Referral Type:   Consultation    Referral Reason:   Specialty Services Required    Requested Specialty:   Dermatology    Number of Visits Requested:   1   May f/u here as needed.  Reviewed expectations re: course of current medical issues. Questions answered. Outlined signs and symptoms indicating need for more acute intervention. Patient verbalized understanding. After Visit Summary given.   SUBJECTIVE:  Travis Sloan is a 23 y.o. male who presents with a skin complaint. Reports having earring "ripped out" about one week ago; now with keloid of R earlobe. Denies bleeding.   OBJECTIVE: Vitals:   07/18/23 1117  BP: 128/83  Pulse: (!) 57  Resp: 16  Temp: 98.3 F (36.8 C)  TempSrc: Oral  SpO2: 98%    General appearance: alert; no distress HEENT: [place]; approx 1x1 cm keloid of RIGHT earlobe Psychological: alert and cooperative; normal mood and affect  No Known Allergies  Past Medical History:  Diagnosis Date   Asthma    Social History   Socioeconomic History   Marital status: Single    Spouse name: Not on file   Number of children: Not on file   Years of education: Not on file   Highest education level: Not on file  Occupational History   Not on file  Tobacco Use   Smoking status: Former    Types: Cigars   Smokeless tobacco: Never  Vaping Use   Vaping status: Former  Substance and Sexual Activity   Alcohol use: Never   Drug use: Never   Sexual activity: Not on file  Other Topics Concern   Not on file  Social History Narrative   Not on file   Social Drivers of Health   Financial Resource Strain: Not on file  Food Insecurity: Not on file  Transportation Needs: Not on file  Physical  Activity: Not on file  Stress: Not on file  Social Connections: Not on file  Intimate Partner Violence: Not on file   Family History  Problem Relation Age of Onset   Diabetes Mother    Hypertension Mother    Cancer Mother    Diabetes Father    Cancer Father    History reviewed. No pertinent surgical history.    Afton Albright, MD 07/18/23 [phone]
